# Patient Record
Sex: Female | Born: 1975 | Race: White | Hispanic: No | Marital: Single | Smoking: Never smoker
Health system: Southern US, Community
[De-identification: ages and names within clinical notes are randomized; demographics above are authoritative.]

## PROBLEM LIST (undated history)

## (undated) DIAGNOSIS — F909 Attention-deficit hyperactivity disorder, unspecified type: Secondary | ICD-10-CM

## (undated) DIAGNOSIS — G43909 Migraine, unspecified, not intractable, without status migrainosus: Secondary | ICD-10-CM

## (undated) DIAGNOSIS — J45909 Unspecified asthma, uncomplicated: Secondary | ICD-10-CM

---

## 2001-05-13 ENCOUNTER — Emergency Department (HOSPITAL_COMMUNITY): Admission: EM | Admit: 2001-05-13 | Discharge: 2001-05-14 | Payer: Self-pay

## 2006-05-08 ENCOUNTER — Other Ambulatory Visit: Admission: RE | Admit: 2006-05-08 | Discharge: 2006-05-08 | Payer: Self-pay | Admitting: Physician Assistant

## 2007-03-11 DIAGNOSIS — C439 Malignant melanoma of skin, unspecified: Secondary | ICD-10-CM

## 2007-03-11 HISTORY — DX: Malignant melanoma of skin, unspecified: C43.9

## 2007-07-10 ENCOUNTER — Inpatient Hospital Stay (HOSPITAL_COMMUNITY): Admission: AD | Admit: 2007-07-10 | Discharge: 2007-07-13 | Payer: Self-pay | Admitting: Obstetrics and Gynecology

## 2007-07-14 ENCOUNTER — Encounter: Admission: RE | Admit: 2007-07-14 | Discharge: 2007-08-13 | Payer: Self-pay | Admitting: Obstetrics and Gynecology

## 2007-08-14 ENCOUNTER — Encounter: Admission: RE | Admit: 2007-08-14 | Discharge: 2007-09-12 | Payer: Self-pay | Admitting: Obstetrics and Gynecology

## 2007-09-13 ENCOUNTER — Encounter: Admission: RE | Admit: 2007-09-13 | Discharge: 2007-10-13 | Payer: Self-pay | Admitting: Obstetrics and Gynecology

## 2007-10-14 ENCOUNTER — Encounter: Admission: RE | Admit: 2007-10-14 | Discharge: 2007-11-13 | Payer: Self-pay | Admitting: Obstetrics and Gynecology

## 2007-11-14 ENCOUNTER — Encounter: Admission: RE | Admit: 2007-11-14 | Discharge: 2007-12-14 | Payer: Self-pay | Admitting: Obstetrics and Gynecology

## 2007-12-15 ENCOUNTER — Encounter: Admission: RE | Admit: 2007-12-15 | Discharge: 2008-01-14 | Payer: Self-pay | Admitting: Obstetrics and Gynecology

## 2008-01-15 ENCOUNTER — Encounter: Admission: RE | Admit: 2008-01-15 | Discharge: 2008-02-13 | Payer: Self-pay | Admitting: Obstetrics and Gynecology

## 2008-02-14 ENCOUNTER — Encounter: Admission: RE | Admit: 2008-02-14 | Discharge: 2008-03-15 | Payer: Self-pay | Admitting: Obstetrics and Gynecology

## 2008-03-16 ENCOUNTER — Encounter: Admission: RE | Admit: 2008-03-16 | Discharge: 2008-04-15 | Payer: Self-pay | Admitting: Obstetrics and Gynecology

## 2008-04-16 ENCOUNTER — Encounter: Admission: RE | Admit: 2008-04-16 | Discharge: 2008-05-13 | Payer: Self-pay | Admitting: Obstetrics and Gynecology

## 2008-05-14 ENCOUNTER — Encounter: Admission: RE | Admit: 2008-05-14 | Discharge: 2008-06-13 | Payer: Self-pay | Admitting: Obstetrics and Gynecology

## 2008-06-14 ENCOUNTER — Encounter: Admission: RE | Admit: 2008-06-14 | Discharge: 2008-06-26 | Payer: Self-pay | Admitting: Obstetrics and Gynecology

## 2010-02-19 ENCOUNTER — Emergency Department (HOSPITAL_COMMUNITY)
Admission: EM | Admit: 2010-02-19 | Discharge: 2010-02-19 | Payer: Self-pay | Source: Home / Self Care | Admitting: Emergency Medicine

## 2010-05-20 LAB — POCT I-STAT, CHEM 8
BUN: 16 mg/dL (ref 6–23)
Chloride: 107 mEq/L (ref 96–112)
Sodium: 141 mEq/L (ref 135–145)

## 2010-07-23 NOTE — H&P (Signed)
Amanda Hayden, Amanda Hayden               ACCOUNT NO.:  [id number]   MEDICAL RECORD NO.:  [id number]          PATIENT TYPE:  INP   LOCATION:                                F[facility]ILITY[facility]  [facility]   PHYSICIAN:  Charles A. Delcambre, MDDATE OF BIRTH:  02-09-1976   DATE OF ADMISSION:  07/20/2007  DATE OF DISCHARGE:                              HISTORY & PHYSICAL   The patient to be admitted for primary cesarean section on Jul 20, 2007.  [facility] Jul 25, 2007.  She will be 39 weeks and 2 days at that time.  She is  gravida 3, para 1-0-1-1.  She did have fourth-degree laceration with  vacuum delivery and for this reason we are doing prophylactic cesarean  section to prevent recurrent fourth-degree tear with risk of fistula  formation.   PAST MEDICAL HISTORY:  Occasional UTI and depression.   SURGICAL HISTORY:  Elective abortion.   MEDICATIONS:  1. Prenatal vitamins.  2. Zoloft 50 mg daily.   ALLERGIES:  NO KNOWN DRUG ALLERGIES.   SOCIAL HISTORY:  No tobacco, ethanol or  drug use.  She is married. She  lives in [place] with her husband.   FAMILY HISTORY:  Thrombophlebitis in her father; chronic hypertension,  father; diabetes, father; father also has colitis and colostomy.   REVIEW OF SYSTEMS:  Denies fever, chills, nausea, vomiting, diarrhea,  constipation, or scotomata.  She does have some right upper quadrant  pain, but that she feels the baby is pushing up and down direction.  Occasional contractions.  No rupture or bleeding.  She did call last  Friday with passage of blood what was felt to be a mucus plug.  She  relays today that mucus went down her leg, but no further leakage of  anything since that time.   PHYSICAL EXAMINATION:  VITAL SIGNS:  Blood pressure 128/88, weight 192  pounds, fetal heart rate 160s, fundal height 39 cm at 37 weeks and 4  days.  LUNGS:  Clear bilaterally.  HEART:  Regular rate and rhythm, 2/6 systolic ejection murmur at the  sternal border.  ABDOMEN:  As  noted.  PELVIC:  Normal external female genitalia.  Bartholin, urethra, Skene  within normal limit.  A slight amount of brown discharge appearing  material in the vagina.  No active bleeding.  No red blood.  Cervix is  very much posterior, I could not reach it with the patient comfort  compromised, at most it may be 1 or 2 cm, but it is posterior and soft.   ASSESSMENT:  Intrauterine pregnancy to be 39 weeks and 2 days, previous  fourth-degree tear, wishing to avoid such in this pregnancy.   PLAN:  Elective cesarean section.  She accepts risks of infection,  bleeding, bowel and bladder damage, blood product risk including  hepatitis and HIV exposure, and ureteral damage.  All questions were  answered and she will be admitted as noted.  Preoperative CBC, type and  screening.      Charles A. Sydnee Cabal, MD  Electronically Signed     CAD/MEDQ  D:  07/08/2007  T:  07/09/2007  Job:  [id number]

## 2010-07-23 NOTE — Discharge Summary (Signed)
[name], Amanda Hayden               ACCOUNT NO.:  [id number]   MEDICAL RECORD NO.:  [id number]          PATIENT TYPE:  INP   LOCATION:  9121                          [facility]ACILITY:  [facility]   PHYSICIAN:  Charles A. Delcambre, MDDATE OF BIRTH:  04/15/1975   DATE OF ADMISSION:  07/10/2007  DATE OF DISCHARGE:  07/13/2007                               DISCHARGE SUMMARY   PRIMARY DISCHARGE DIAGNOSES:  1. Intrauterine pregnancy 38 weeks and 4 days.  2. Desiring primary cesarean section secondary to first pregnancy,      having a fourth-degree laceration with a vaginal birth.   PROCEDURE:  Primary low transverse cesarean section.   DISPOSITION:  The patient discharged home to follow up in the office in  24 hours to discontinue staples.  She was given convalescent  instructions for home.  No lifting greater than the baby or 25 pounds  for 4 weeks.  No driving for 2 weeks.  Shower okay for 2 weeks.  Bath  okay thereafter.  Precautions regarding to call for temperature greater  100 degrees, incisional drainage, or erythema, or pus draining, or  increased pain or bleeding.   LABORATORY:  Postoperative hemoglobin 9.2, hematocrit 26.0, blood type  A+.   She had a vigorous female 3725 gm, 46.99 cm, Apgars 8 and 9, and placenta  to labor and delivery.   HISTORY AND PHYSICAL:  Written on the chart.   HOSPITAL COURSE:  The patient was noted to come in with ruptured  membranes and was to have cesarean section secondary to previous fourth-  degree laceration, difficult delivery, and difficult healing process.  She was admitted and underwent primary low transverse cesarean section  without complication.  Postoperatively on day #1, she had routine care.  P.o. pain medications were adequate after Duramorph effect waned.  She  had spontaneous flatus on postop day #1, was given general diet.  She  ambulated day 1 and day 2 and continued to do well.  Vitals were stable,  and for this reason on day 3, she was  discharged home.  She was given  prescriptions for Percocet 5/325 1-2 p.o. q.4 h. p.r.n. #40, Motrin 600  mg one p.o. q.6 h. p.r.n. #30 refill x1, and Tantum one p.o. daily #30,  refill x1.  She will return to the office in 24 hours for staples to be  discontinued and birth control will be discussed after 6-week postpartum  delivery      Charles A. Sydnee Cabal, MD  Electronically Signed     CAD/MEDQ  D:  07/13/2007  T:  07/13/2007  Job:  [id number]

## 2010-07-23 NOTE — Op Note (Signed)
Amanda Hayden, Amanda Hayden               ACCOUNT NO.:  [id number]   MEDICAL RECORD NO.:  [id number]          PATIENT TYPE:  INP   LOCATION:  9198                          [facility]ILITY:  [facility]   PHYSICIAN:  Gerald Leitz, MD          DATE OF BIRTH:  [birth year]   DATE OF PROCEDURE:  07/11/2007  DATE OF DISCHARGE:                               OPERATIVE REPORT   PREOPERATIVE DIAGNOSES:  1. A 38 and 4/7th-week intrauterine pregnancy.  2. History of fourth-degree perineal laceration.  3. Premature rupture of membranes.  4. Desires primary cesarean section.   POSTOPERATIVE DIAGNOSES:  1. A 38 and 4/7th-week intrauterine pregnancy.  2. History of fourth-degree perineal laceration.  3. Premature rupture of membranes.  4. Desires primary cesarean section.   PROCEDURE:  Primary low transverse cesarean section.   SURGEON:  Gerald Leitz, MD   ASSISTANT:  None.   ANESTHESIA:  Spinal.   SPECIMEN:  Placenta.   DISPOSITION:  To Labor and Delivery.   ESTIMATED BLOOD LOSS:  800 mL.   IV FLUIDS:  1300 mL of LR.   URINE OUTPUT:  50 mL of clear urine.   COMPLICATIONS:  None.   INDICATIONS:  This is a 35 year old G 3, P 1-0-1-1 who had a history of  fourth-degree laceration with a previous vaginal delivery.  She  presented with ruptured membranes and desires primary cesarean section  to avoid complications from previous laceration.   PROCEDURE:  Informed consent was obtained.  Risks, benefits, and  alternatives were discussed.  The patient was taken to the operating  room where she was placed with a spinal anesthesia.  Spinal was found to  be adequate.  She was then prepped and draped in the usual sterile  fashion.  A Pfannenstiel skin incision was made with a scalpel and  carried down to the underlying layer of fascia.  The fascia was incised  in the midline and the incision was extended laterally with Mayo  scissors.  The superior aspect of the fascial incision was elevated with  Kocher clamps and  the underlying rectus muscles were dissected off.  This was repeated on the inferior aspect of the fascial incision.  The  rectus muscles were separated in the midline.  The peritoneum was  identified, tented up and entered sharply with Metzenbaum scissors.  The  incision was extended superiorly and inferiorly with good visualization  of the bladder.  Alexis retractor was then inserted into the peritoneal  cavity.  The vesicouterine peritoneum was identified, tented up and  entered sharply with Metzenbaum scissors and the bladder flap was  created digitally.  The uterus was incised in the lower uterine segment  and the incision was extended laterally using blunt dissection.  The  infant's head was delivered.  Mouth and nose were bulb suctioned.  The  rest of the infant was delivered.  Cord was clamped x2 and cut.  The  infant was handed off to the waiting neonatologist.  The placenta was  expressed.  The uterus was cleared of all clot and debris.  It was  exteriorized.  The uterine incision was repaired with 0 Vicryl in a  running locked fashion.  A second layer of the same suture was used for  excellent hemostasis.  The uterus was returned to the abdomen.  The  abdomen was copiously irrigated.  Excellent hemostasis was again  assured.  The Alexis retractor was removed.  The peritoneum was  reapproximated with 2-0 Vicryl.  The fascia was reapproximated with 0  PDS.  The skin was reapproximated with staples.  Sponge, lap and needle  counts were correct x2.  Ancef 2 g were given at cord clamp.   FINDINGS:  Female infant cephalic presentation with Apgars of 8 and 9 at 1  and 5 minutes respectively weighing 8 pounds 3 ounces.      Gerald Leitz, MD  Electronically Signed     TC/MEDQ  D:  07/11/2007  T:  07/11/2007  Job:  [id number]

## 2015-07-11 DIAGNOSIS — D229 Melanocytic nevi, unspecified: Secondary | ICD-10-CM

## 2015-07-11 HISTORY — DX: Melanocytic nevi, unspecified: D22.9

## 2016-10-17 ENCOUNTER — Emergency Department (HOSPITAL_COMMUNITY): Payer: Self-pay

## 2016-10-17 ENCOUNTER — Encounter (HOSPITAL_COMMUNITY): Payer: Self-pay | Admitting: Emergency Medicine

## 2016-10-17 ENCOUNTER — Emergency Department (HOSPITAL_COMMUNITY)
Admission: EM | Admit: 2016-10-17 | Discharge: 2016-10-17 | Disposition: A | Discharge status: Home / Self Care | Payer: Self-pay | Attending: Emergency Medicine | Admitting: Emergency Medicine

## 2016-10-17 DIAGNOSIS — R42 Dizziness and giddiness: Secondary | ICD-10-CM | POA: Insufficient documentation

## 2016-10-17 DIAGNOSIS — G43909 Migraine, unspecified, not intractable, without status migrainosus: Secondary | ICD-10-CM | POA: Insufficient documentation

## 2016-10-17 DIAGNOSIS — Z79899 Other long term (current) drug therapy: Secondary | ICD-10-CM | POA: Insufficient documentation

## 2016-10-17 DIAGNOSIS — R112 Nausea with vomiting, unspecified: Secondary | ICD-10-CM | POA: Insufficient documentation

## 2016-10-17 DIAGNOSIS — F909 Attention-deficit hyperactivity disorder, unspecified type: Secondary | ICD-10-CM | POA: Insufficient documentation

## 2016-10-17 DIAGNOSIS — R5383 Other fatigue: Secondary | ICD-10-CM

## 2016-10-17 DIAGNOSIS — R0602 Shortness of breath: Secondary | ICD-10-CM | POA: Insufficient documentation

## 2016-10-17 DIAGNOSIS — J45909 Unspecified asthma, uncomplicated: Secondary | ICD-10-CM | POA: Insufficient documentation

## 2016-10-17 HISTORY — DX: Unspecified asthma, uncomplicated: J45.909

## 2016-10-17 HISTORY — DX: Attention-deficit hyperactivity disorder, unspecified type: F90.9

## 2016-10-17 HISTORY — DX: Migraine, unspecified, not intractable, without status migrainosus: G43.909

## 2016-10-17 LAB — COMPREHENSIVE METABOLIC PANEL
ALT: 22 U/L (ref 14–54)
AST: 20 U/L (ref 15–41)
Albumin: 3.6 g/dL (ref 3.5–5.0)
Alkaline Phosphatase: 31 U/L — ABNORMAL LOW (ref 38–126)
Anion gap: 6 (ref 5–15)
BILIRUBIN TOTAL: 0.6 mg/dL (ref 0.3–1.2)
BUN: 19 mg/dL (ref 6–20)
CO2: 23 mmol/L (ref 22–32)
CREATININE: 0.81 mg/dL (ref 0.44–1.00)
Calcium: 8.3 mg/dL — ABNORMAL LOW (ref 8.9–10.3)
Chloride: 111 mmol/L (ref 101–111)
GFR calc Af Amer: >60 mL/min (ref >60)
Glucose, Bld: 90 mg/dL (ref 65–99)
POTASSIUM: 4.2 mmol/L (ref 3.5–5.1)
Sodium: 140 mmol/L (ref 135–145)
TOTAL PROTEIN: 6.2 g/dL — AB (ref 6.5–8.1)

## 2016-10-17 LAB — D-DIMER, QUANTITATIVE: D-Dimer, Quant: <0.27 ug/mL-FEU (ref 0.00–0.50)

## 2016-10-17 LAB — CBC WITH DIFFERENTIAL/PLATELET
BASOS ABS: 0 10*3/uL (ref 0.0–0.1)
BASOS PCT: 0 %
Eosinophils Absolute: 0.1 10*3/uL (ref 0.0–0.7)
Eosinophils Relative: 3 %
HEMATOCRIT: 37.8 % (ref 36.0–46.0)
HEMOGLOBIN: 13.1 g/dL (ref 12.0–15.0)
LYMPHS PCT: 30 %
Lymphs Abs: 1.2 10*3/uL (ref 0.7–4.0)
MCH: 31.6 pg (ref 26.0–34.0)
MCHC: 34.7 g/dL (ref 30.0–36.0)
MCV: 91.1 fL (ref 78.0–100.0)
Monocytes Absolute: 0.4 10*3/uL (ref 0.1–1.0)
Monocytes Relative: 11 %
Neutro Abs: 2.3 10*3/uL (ref 1.7–7.7)
Neutrophils Relative %: 56 %
Platelets: 171 10*3/uL (ref 150–400)
RBC: 4.15 MIL/uL (ref 3.87–5.11)
RDW: 12.6 % (ref 11.5–15.5)
WBC: 4.1 10*3/uL (ref 4.0–10.5)

## 2016-10-17 LAB — I-STAT TROPONIN, ED: TROPONIN I, POC: 0 ng/mL (ref 0.00–0.08)

## 2016-10-17 LAB — POC URINE PREG, ED: PREG TEST UR: NEGATIVE

## 2016-10-17 MED ORDER — METOCLOPRAMIDE HCL 5 MG/ML IJ SOLN
10.0000 mg | Freq: Once | INTRAMUSCULAR | Status: AC
  Administered 2016-10-17: 10 mg via INTRAVENOUS
  Filled 2016-10-17: qty 2

## 2016-10-17 MED ORDER — SODIUM CHLORIDE 0.9 % IV BOLUS (SEPSIS)
1000.0000 mL | Freq: Once | INTRAVENOUS | Status: AC
  Administered 2016-10-17: 1000 mL via INTRAVENOUS

## 2016-10-17 MED ORDER — DIPHENHYDRAMINE HCL 50 MG/ML IJ SOLN
25.0000 mg | Freq: Once | INTRAMUSCULAR | Status: AC
  Administered 2016-10-17: 25 mg via INTRAVENOUS
  Filled 2016-10-17: qty 1

## 2016-10-17 MED ORDER — IOPAMIDOL (ISOVUE-300) INJECTION 61%
INTRAVENOUS | Status: AC
  Filled 2016-10-17: qty 75

## 2016-10-17 NOTE — ED Triage Notes (Signed)
Pt arrived via [facility]. Pt reports fatigue since yesterday, couldn't complete normal workout at the gym. Migraine behind L eye since yesterday with nausea with hx of migraines. Also feels like the room is spinning.

## 2016-10-17 NOTE — ED Notes (Signed)
ED Provider at bedside. 

## 2016-10-17 NOTE — ED Notes (Signed)
Patient transported to X-ray 

## 2016-10-17 NOTE — ED Provider Notes (Signed)
[facility] DEPT Provider Note   CSN: [id number] Arrival date & time: 10/17/16  5638     History   Chief Complaint Chief Complaint  Patient presents with  . Headache    HPI Amanda Hayden is a 41 y.o. female.  HPI  40 year old female with a history of ADHD, asthma, and migraines presents with weakness, fatigue, and dizziness. She states that yesterday she had some fatigue. She went to do leg exercises in the morning which is typical for her but she had a hard time doing it and had to take her asthma inhaler which she rarely does. This did transiently help with some dyspnea. She had easily once more in the day. However she's been feeling just progressively fatigued with no focal weakness. She did develop an atypical headache for her that she states was mild last night which was pain behind her left eye. However this headache resolved on its own and she did not even need to take meds. Then this morning before going to work she felt very dizzy whenever she would stand up. She would get both an off-balance sensation but also feeling like she's going to pass out. She denies any ear ringing or ear pain. No focal weakness/numbness. No chest pain. She does not feel shortness of breath right now but feels like maybe she can get a full breath. She has otherwise not been sick and denies fevers, neck pain, diarrhea, abdominal pain, or urinary symptoms. No bleeding or melena. She does feel like she's beginning to get a headache since being in the ambulance. This does feel like typical migraine that starts occipitally. She is requesting medicines for this migraine.  Past Medical History:  Diagnosis Date  . ADHD   . Asthma   . Migraine     There are no active problems to display for this patient.   Past Surgical History:  Procedure Laterality Date  . CESAREAN SECTION      OB History    No data available       Home Medications    Prior to Admission medications   Medication Sig Start  Date End Date Taking? Authorizing Provider  amphetamine-dextroamphetamine (ADDERALL) 10 MG tablet Take 10 mg by mouth daily with breakfast.   Yes [provider]    Family History No family history on file.  Social History Social History  Substance Use Topics  . Smoking status: Never Smoker  . Smokeless tobacco: Never Used  . Alcohol use Yes     Comment: occ     Allergies   Patient has no known allergies.   Review of Systems Review of Systems  Constitutional: Positive for fatigue. Negative for fever.  Respiratory: Positive for shortness of breath. Negative for chest tightness.   Cardiovascular: Negative for chest pain.  Gastrointestinal: Positive for nausea and vomiting. Negative for abdominal pain and diarrhea.  Genitourinary: Negative for dysuria.  Musculoskeletal: Negative for neck pain.  Neurological: Positive for dizziness, weakness, light-headedness and headaches. Negative for syncope and numbness.  All other systems reviewed and are negative.    Physical Exam Updated Vital Signs BP 103/63   Pulse 68   Temp 98 F (36.7 C) (Oral)   Resp 14   Ht 5\' 5"  (1.651 m)   Wt 66.7 kg (147 lb)   LMP 10/03/2016   SpO2 100%   BMI 24.46 kg/m   Physical Exam  Constitutional: She is oriented to person, place, and time. She appears well-developed and well-nourished. No distress.  HENT:  Head: Normocephalic and atraumatic.  Right Ear: External ear normal.  Left Ear: External ear normal.  Nose: Nose normal.  Eyes: Pupils are equal, round, and reactive to light. EOM are normal. Right eye exhibits no discharge. Left eye exhibits no discharge.  Neck: Normal range of motion. Neck supple.  Cardiovascular: Normal rate, regular rhythm and normal heart sounds.   Pulmonary/Chest: Effort normal and breath sounds normal. She has no wheezes.  Abdominal: Soft. There is no tenderness.  Neurological: She is alert and oriented to person, place, and time.  CN 3-12 grossly  intact. 5/5 strength in all 4 extremities. Grossly normal sensation. Normal finger to nose.   Skin: Skin is warm and dry. She is not diaphoretic.  Nursing note and vitals reviewed.    ED Treatments / Results  Labs (all labs ordered are listed, but only abnormal results are displayed) Labs Reviewed  COMPREHENSIVE METABOLIC PANEL - Abnormal; Notable for the following:       Result Value   Calcium 8.3 (*)    Total Protein 6.2 (*)    Alkaline Phosphatase 31 (*)    All other components within normal limits  CBC WITH DIFFERENTIAL/PLATELET  D-DIMER, QUANTITATIVE (NOT AT [facility])  POC URINE PREG, ED  I-STAT TROPONIN, ED    EKG  EKG Interpretation  Date/Time:  Friday October 17 2016 10:17:04 EDT Ventricular Rate:  76 PR Interval:    QRS Duration: 79 QT Interval:  426 QTC Calculation: 479 R Axis:   82 Text Interpretation:  Sinus rhythm Probable left atrial enlargement tachycardia and ST/T changes resolved compared to 2003 Confirmed by Sherwood Gambler [phone]) on 10/17/2016 10:23:47 AM       Radiology Dg Chest 2 View  Result Date: 10/17/2016 CLINICAL DATA:  Increased fatigue and shortness of breath. EXAM: CHEST  2 VIEW COMPARISON:  02/19/2010 FINDINGS: Right lung clear. Nodular density is seen in the left upper lung, projecting between the anterior first and second ribs. Left lung otherwise clear. The cardiopericardial silhouette is within normal limits for size. The visualized bony structures of the thorax are intact. Telemetry leads overlie the chest. IMPRESSION: Left upper lobe nodular opacity. CT chest without contrast could be used to further evaluate. Electronically Signed   By: Misty Stanley M.D.   On: 10/17/2016 10:37   Ct Chest Wo Contrast  Result Date: 10/17/2016 CLINICAL DATA:  Lung nodule. EXAM: CT CHEST WITHOUT CONTRAST TECHNIQUE: Multidetector CT imaging of the chest was performed following the standard protocol without IV contrast. COMPARISON:  Radiographs of same day.  FINDINGS: Cardiovascular: No significant vascular findings. Normal heart size. No pericardial effusion. Mediastinum/Nodes: No enlarged mediastinal or axillary lymph nodes. Thyroid gland, trachea, and esophagus demonstrate no significant findings. Lungs/Pleura: Lungs are clear. No pleural effusion or pneumothorax. Upper Abdomen: No acute abnormality. Musculoskeletal: No chest wall mass or suspicious bone lesions identified. IMPRESSION: No abnormality seen in the chest. Electronically Signed   By: Marijo Conception, M.D.   On: 10/17/2016 12:34    Procedures Procedures (including critical care time)  Medications Ordered in ED Medications  sodium chloride 0.9 % bolus 1,000 mL (0 mLs Intravenous Stopped 10/17/16 1251)  metoCLOPramide (REGLAN) injection 10 mg (10 mg Intravenous Given 10/17/16 1014)  diphenhydrAMINE (BENADRYL) injection 25 mg (25 mg Intravenous Given 10/17/16 1014)     Initial Impression / Assessment and Plan / ED Course  I have reviewed the triage vital signs and the nursing notes.  Pertinent labs & imaging results that were available  during my care of the patient were reviewed by me and considered in my medical decision making (see chart for details).     Patient's headache is much better after IV Reglan/Benadryl. She is feeling much better. She states she still feels tired and fatigued but there are no focal findings on exam or workup to suggest why she is feeling so weak/fatigued. There are no focal neuro deficits. No severe headache and headache from yesterday is resolved and was mild. I do not think CT imaging is needed as I have a low suspicion for acute CNS emergency. No chest pain. No current shortness of breath. CT chest obtained due to possible nodule seen on chest x-ray and recommendation by radiology but this is unremarkable. Recommend increase fluids and rest. Follow-up with PCP. Discussed return precautions.  Final Clinical Impressions(s) / ED Diagnoses   Final diagnoses:   Fatigue, unspecified type  Migraine without status migrainosus, not intractable, unspecified migraine type    New Prescriptions Discharge Medication List as of 10/17/2016 12:43 PM       Sherwood Gambler, MD 10/17/16 1302

## 2018-09-21 ENCOUNTER — Other Ambulatory Visit: Payer: Self-pay | Admitting: Chiropractic Medicine

## 2019-02-10 ENCOUNTER — Other Ambulatory Visit: Payer: Self-pay

## 2019-02-10 DIAGNOSIS — Z20822 Contact with and (suspected) exposure to covid-19: Secondary | ICD-10-CM

## 2019-02-12 LAB — NOVEL CORONAVIRUS, NAA: SARS-CoV-2, NAA: DETECTED — AB

## 2019-02-23 IMAGING — CR DG CHEST 2V
2 series · 2 of 2 positions shown · non-contrast
Comparison: 02/19/2010

CLINICAL DATA: Increased fatigue and shortness of breath.

EXAM:
CHEST  2 VIEW

[w chest pa]
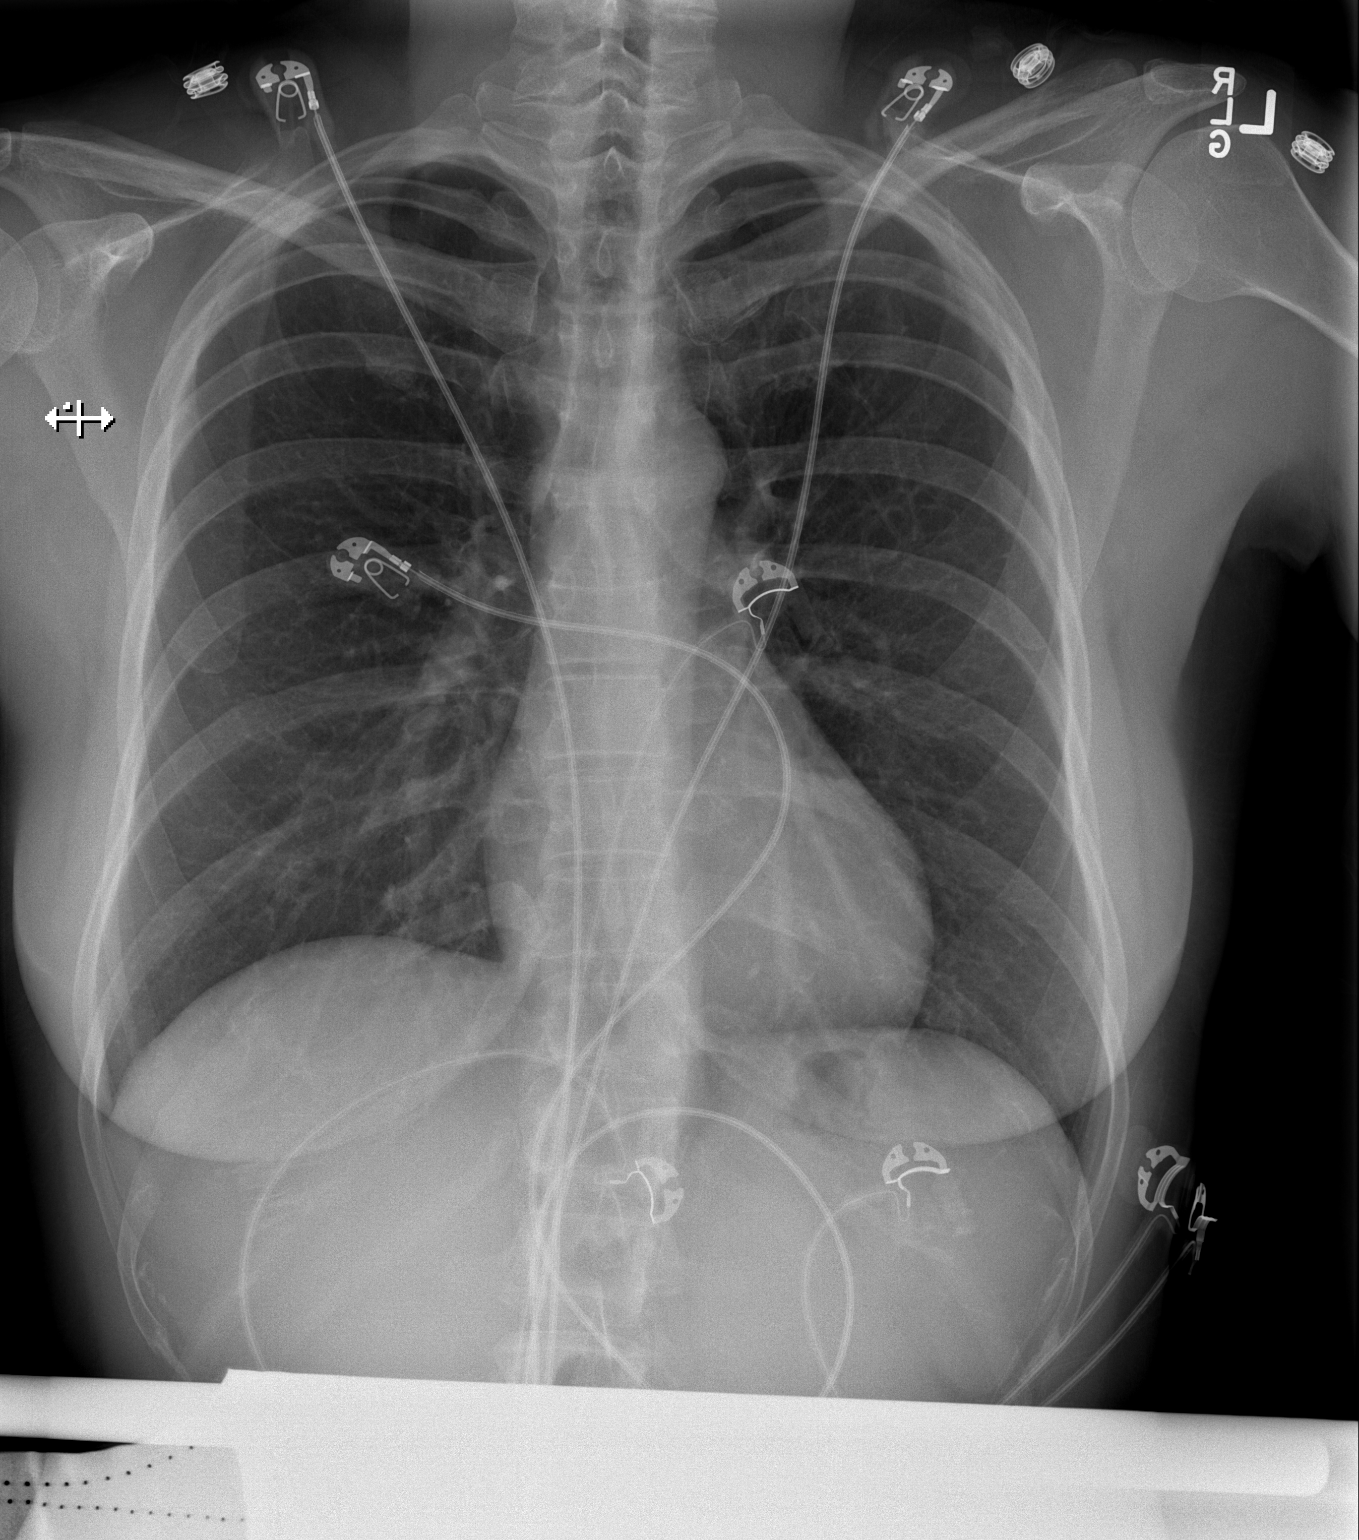

[w chest lat]
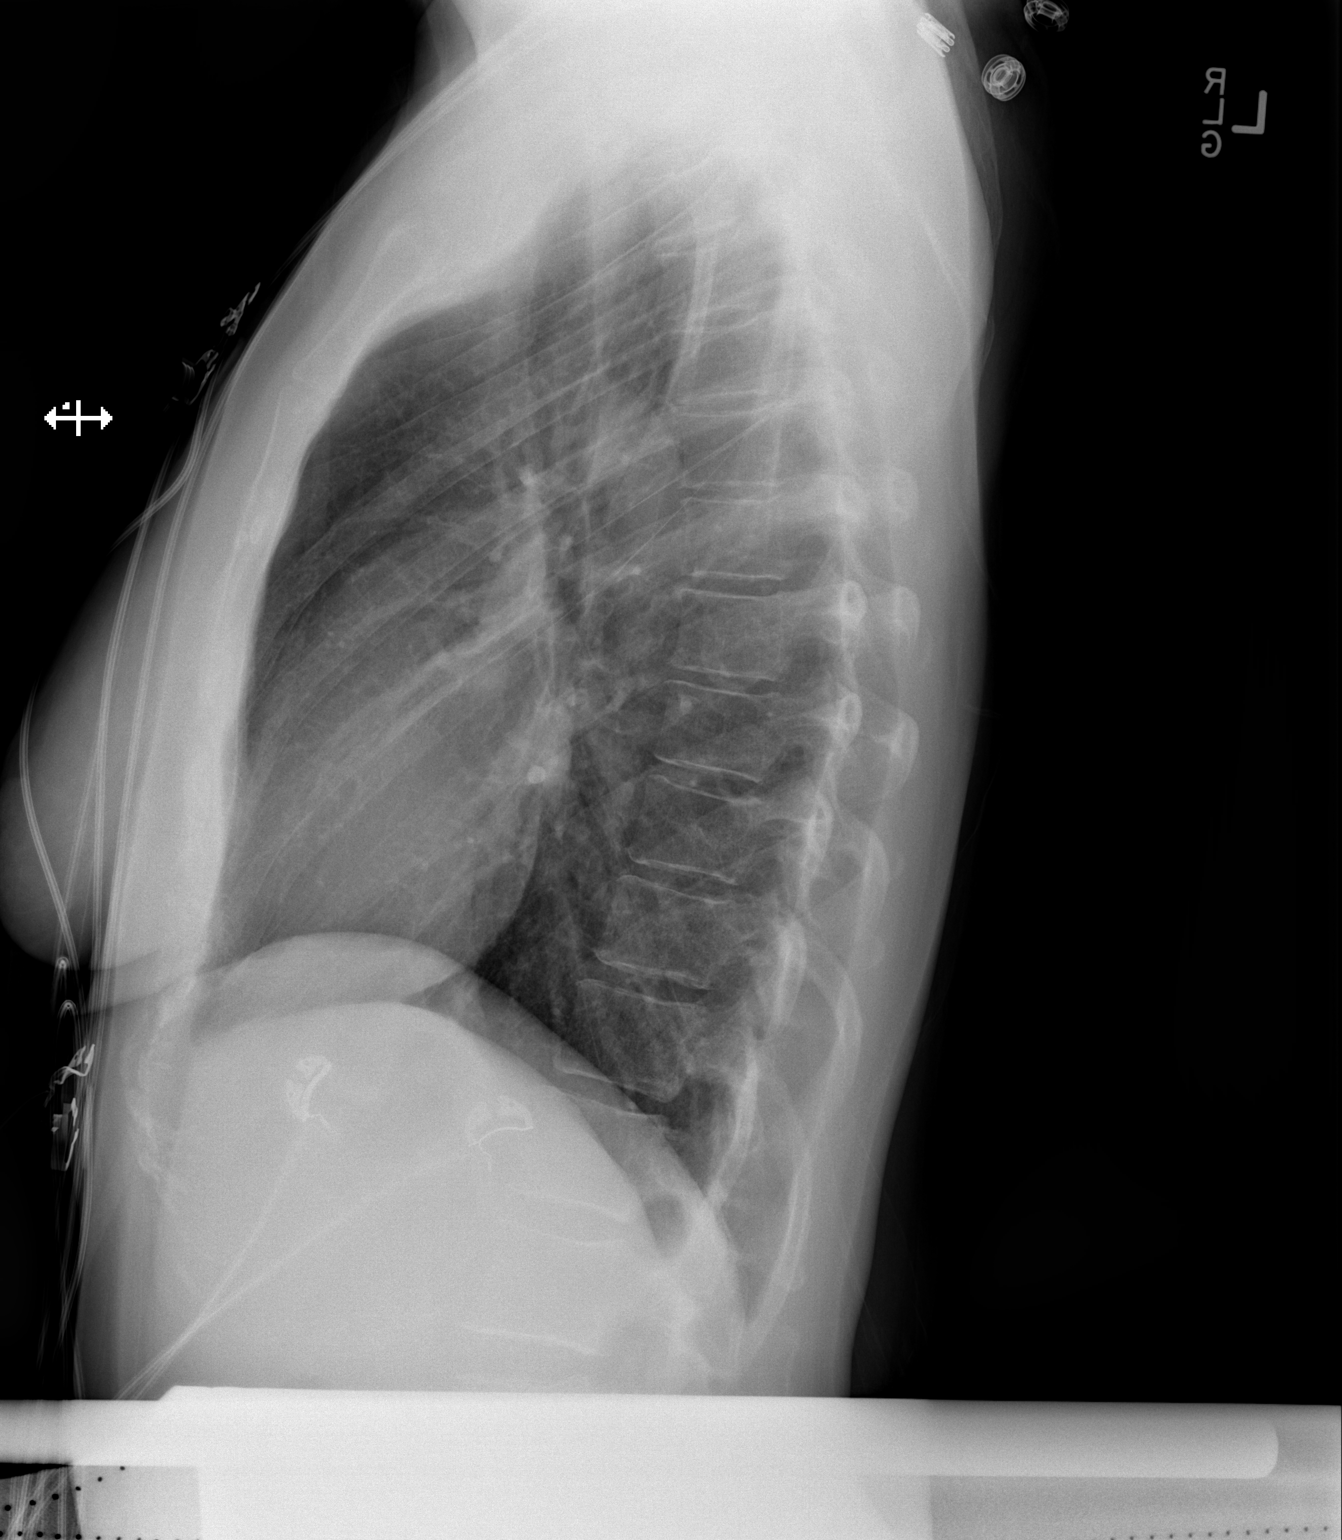

[2 of 2 positions shown; findings below may reference images not displayed]

FINDINGS: Right lung clear. Nodular density is seen in the left upper lung,
projecting between the anterior first and second ribs. Left lung
otherwise clear. The cardiopericardial silhouette is within normal
limits for size. The visualized bony structures of the thorax are
intact. Telemetry leads overlie the chest.
IMPRESSION: Left upper lobe nodular opacity. CT chest without contrast could be
used to further evaluate.

## 2019-08-09 ENCOUNTER — Telehealth: Payer: Self-pay | Admitting: *Deleted

## 2019-08-09 NOTE — Telephone Encounter (Signed)
Prior authorization for adapalene/benzoyl peroxide gel faxed to [phone]

## 2021-07-25 ENCOUNTER — Ambulatory Visit: Payer: Self-pay | Admitting: Physician Assistant

## 2021-08-07 ENCOUNTER — Encounter: Payer: Self-pay | Admitting: Physician Assistant

## 2021-08-07 ENCOUNTER — Ambulatory Visit: Payer: 59 | Admitting: Physician Assistant

## 2021-08-07 DIAGNOSIS — Z85820 Personal history of malignant melanoma of skin: Secondary | ICD-10-CM

## 2021-08-07 DIAGNOSIS — Z86018 Personal history of other benign neoplasm: Secondary | ICD-10-CM | POA: Diagnosis not present

## 2021-08-07 DIAGNOSIS — L700 Acne vulgaris: Secondary | ICD-10-CM

## 2021-08-07 DIAGNOSIS — Z1283 Encounter for screening for malignant neoplasm of skin: Secondary | ICD-10-CM

## 2021-08-07 MED ORDER — TRETINOIN 0.05 % EX CREA: TOPICAL_CREAM | Freq: Every day | CUTANEOUS | 6 refills | Status: AC

## 2021-08-07 MED ORDER — CLINDAMYCIN PHOSPHATE 1 % EX GEL: Freq: Every day | CUTANEOUS | 4 refills | Status: AC

## 2021-08-07 NOTE — Progress Notes (Signed)
   Follow-Up Visit   Subjective  Amanda Hayden is a 46 y.o. female who presents for the following: Annual Exam (No new concerns- Personal history of atypical nevi and melanoma.  ). She has been diagnosed with Hashimoto's disease and histamine disorder.    The following portions of the chart were reviewed this encounter and updated as appropriate:  Tobacco  Allergies  Problems  Med Hx  Surg Hx  Fam Hx      Objective  Well appearing patient in no apparent distress; mood and affect are within normal limits.  A full examination was performed including scalp, head, eyes, ears, nose, lips, neck, chest, axillae, abdomen, back, buttocks, bilateral upper extremities, bilateral lower extremities, hands, feet, fingers, toes, fingernails, and toenails. All findings within normal limits unless otherwise noted below.  Full body skin examination- No atypical nevi or signs of NMSC noted at the time of the visit.   Mid Back White scar- clear  Head - Anterior (Face) Erythematous papules and pustules with comedones    Assessment & Plan  Personal history of malignant melanoma of skin Mid Back  Yearly skin examination   Acne vulgaris Head - Anterior (Face)  clindamycin (CLINDAGEL) 1 % gel - Head - Anterior (Face) Apply topically daily.  tretinoin (RETIN-A) 0.05 % cream - Head - Anterior (Face) Apply topically at bedtime.  Encounter for screening for malignant neoplasm of skin  Yearly skin examination     I, Jericka Kadar, PA-C, have reviewed all documentation's for this visit.  The documentation on 08/07/21 for the exam, diagnosis, procedures and orders are all accurate and complete.
